# Patient Record
Sex: Female | Born: 1990 | Race: White | Hispanic: No | Marital: Single | Smoking: Never smoker
Health system: Southern US, Community
[De-identification: ages and names within clinical notes are randomized; demographics above are authoritative.]

---
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
## 2009-01-10 ENCOUNTER — Ambulatory Visit: Payer: Self-pay | Admitting: Family Medicine

## 2009-01-10 DIAGNOSIS — A6004 Herpesviral vulvovaginitis: Secondary | ICD-10-CM | POA: Insufficient documentation

## 2009-05-30 ENCOUNTER — Other Ambulatory Visit: Admission: RE | Admit: 2009-05-30 | Discharge: 2009-05-30 | Payer: Self-pay | Admitting: Family Medicine

## 2009-05-30 ENCOUNTER — Encounter: Payer: Self-pay | Admitting: Family Medicine

## 2009-05-30 ENCOUNTER — Ambulatory Visit: Payer: Self-pay | Admitting: Family Medicine

## 2009-05-30 DIAGNOSIS — N912 Amenorrhea, unspecified: Secondary | ICD-10-CM | POA: Insufficient documentation

## 2009-06-06 ENCOUNTER — Encounter: Payer: Self-pay | Admitting: Internal Medicine

## 2009-06-06 ENCOUNTER — Encounter: Payer: Self-pay | Admitting: Family Medicine

## 2009-07-06 ENCOUNTER — Inpatient Hospital Stay (HOSPITAL_COMMUNITY): Admission: AD | Admit: 2009-07-06 | Discharge: 2009-07-06 | Payer: Self-pay | Admitting: Obstetrics

## 2009-11-11 ENCOUNTER — Ambulatory Visit (HOSPITAL_COMMUNITY): Admission: RE | Admit: 2009-11-11 | Discharge: 2009-11-11 | Payer: Self-pay | Admitting: Obstetrics

## 2010-08-18 ENCOUNTER — Emergency Department (HOSPITAL_BASED_OUTPATIENT_CLINIC_OR_DEPARTMENT_OTHER)
Admission: EM | Admit: 2010-08-18 | Discharge: 2010-08-18 | Payer: Self-pay | Source: Home / Self Care | Admitting: Emergency Medicine

## 2010-08-24 LAB — DIFFERENTIAL
Basophils Absolute: 0 10*3/uL (ref 0.0–0.1)
Basophils Relative: 0 % (ref 0–1)
Eosinophils Absolute: 0.1 10*3/uL (ref 0.0–0.7)
Eosinophils Relative: 1 % (ref 0–5)
Lymphocytes Relative: 14 % (ref 12–46)
Lymphs Abs: 1.6 10*3/uL (ref 0.7–4.0)
Monocytes Absolute: 0.7 10*3/uL (ref 0.1–1.0)
Monocytes Relative: 6 % (ref 3–12)
Neutro Abs: 9.6 10*3/uL — ABNORMAL HIGH (ref 1.7–7.7)
Neutrophils Relative %: 80 % — ABNORMAL HIGH (ref 43–77)

## 2010-08-24 LAB — CBC
HCT: 36.1 % (ref 36.0–46.0)
Hemoglobin: 12.1 g/dL (ref 12.0–15.0)
MCH: 29.4 pg (ref 26.0–34.0)
MCHC: 33.5 g/dL (ref 30.0–36.0)
MCV: 87.8 fL (ref 78.0–100.0)
Platelets: 188 10*3/uL (ref 150–400)
RBC: 4.11 MIL/uL (ref 3.87–5.11)
RDW: 11.8 % (ref 11.5–15.5)
WBC: 12.1 10*3/uL — ABNORMAL HIGH (ref 4.0–10.5)

## 2010-08-24 LAB — BASIC METABOLIC PANEL
BUN: 10 mg/dL (ref 6–23)
CO2: 26 mEq/L (ref 19–32)
Calcium: 8.6 mg/dL (ref 8.4–10.5)
Chloride: 108 mEq/L (ref 96–112)
Creatinine, Ser: 0.7 mg/dL (ref 0.4–1.2)
GFR calc Af Amer: >60 mL/min (ref >60)
GFR calc non Af Amer: >60 mL/min (ref >60)
Glucose, Bld: 97 mg/dL (ref 70–99)
Potassium: 4 mEq/L (ref 3.5–5.1)
Sodium: 141 mEq/L (ref 135–145)

## 2010-08-24 LAB — URINALYSIS, ROUTINE W REFLEX MICROSCOPIC
Bilirubin Urine: NEGATIVE
Hgb urine dipstick: NEGATIVE
Ketones, ur: NEGATIVE mg/dL
Leukocytes, UA: NEGATIVE
Nitrite: POSITIVE — AB
Protein, ur: NEGATIVE mg/dL
Specific Gravity, Urine: 1.01 (ref 1.005–1.030)
Urine Glucose, Fasting: NEGATIVE mg/dL
Urobilinogen, UA: 0.2 mg/dL (ref 0.0–1.0)
pH: 7 (ref 5.0–8.0)

## 2010-08-24 LAB — URINE MICROSCOPIC-ADD ON

## 2010-08-24 LAB — PREGNANCY, URINE: Preg Test, Ur: NEGATIVE

## 2010-11-11 LAB — CBC
MCHC: 34.1 g/dL (ref 30.0–36.0)
RBC: 3.95 MIL/uL (ref 3.87–5.11)
RDW: 12.2 % (ref 11.5–15.5)

## 2010-11-11 LAB — HCG, QUANTITATIVE, PREGNANCY: hCG, Beta Chain, Quant, S: 13850 m[IU]/mL — ABNORMAL HIGH (ref <5)

## 2010-11-11 LAB — URINALYSIS, ROUTINE W REFLEX MICROSCOPIC
Bilirubin Urine: NEGATIVE
Glucose, UA: NEGATIVE mg/dL
Ketones, ur: NEGATIVE mg/dL
pH: 7 (ref 5.0–8.0)

## 2010-11-11 LAB — WET PREP, GENITAL: Yeast Wet Prep HPF POC: NONE SEEN

## 2010-11-11 LAB — GC/CHLAMYDIA PROBE AMP, GENITAL
Chlamydia, DNA Probe: NEGATIVE
GC Probe Amp, Genital: NEGATIVE

## 2010-11-11 LAB — URINE MICROSCOPIC-ADD ON

## 2010-11-11 LAB — ABO/RH: ABO/RH(D): O POS

## 2011-05-24 IMAGING — CT CT HEAD W/O CM
1 series · 16 of 30 positions shown, 20 images · non-contrast
Comparison: None.

CLINICAL DATA: 19-year-old female with syncope, frontal headache,
dizziness.

CT HEAD WITHOUT CONTRAST
TECHNIQUE: Contiguous axial images were obtained from the base of
the skull through the vertex without contrast.

[Series 2: head 4.8 h37s · axial · 0.42mm/px · z∈[-96,+40]mm · 16 of 32 slices shown, 20 images]
[im 2/32  brain]
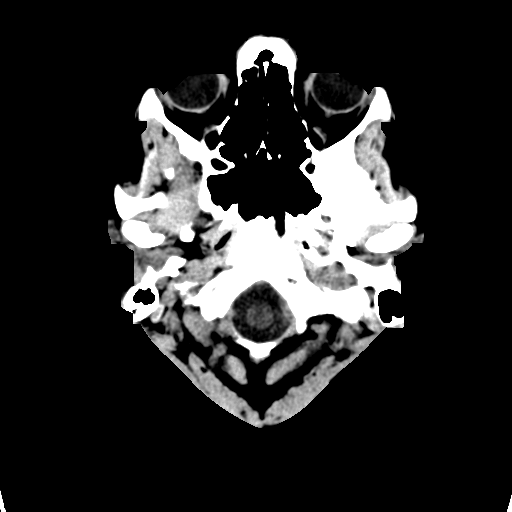
[im 2/32  bone]
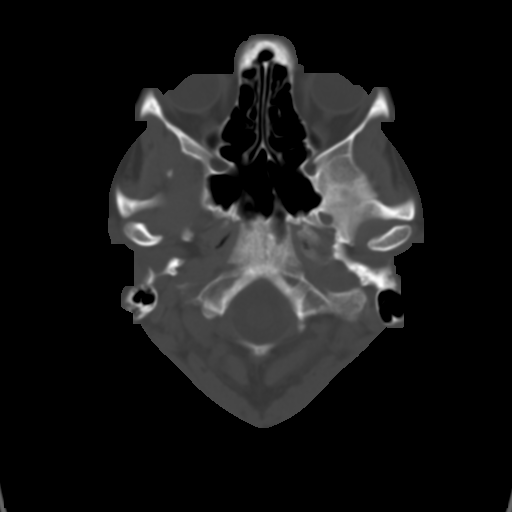
[im 4/32  brain]
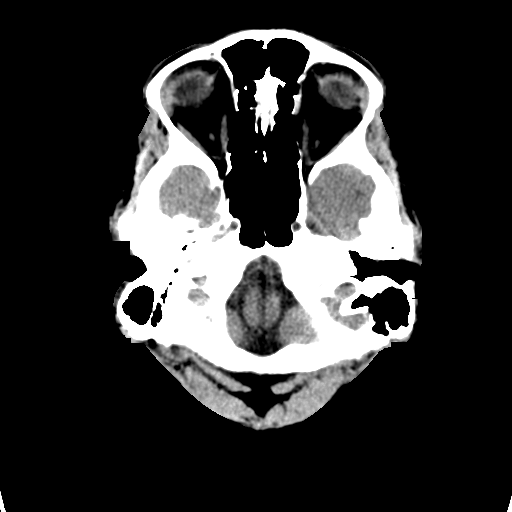
[im 6/32  brain]
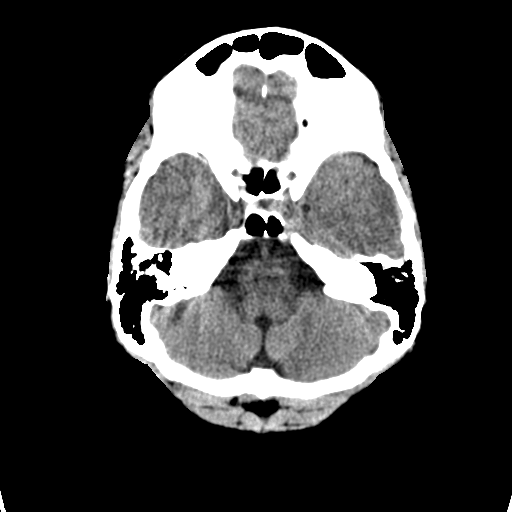
[im 8/32  brain]
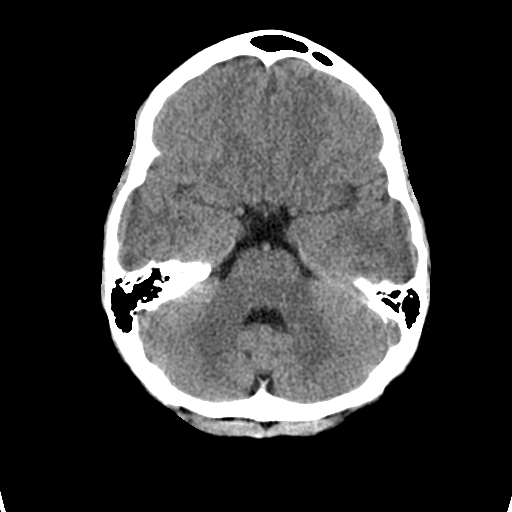
[im 9/32  brain]
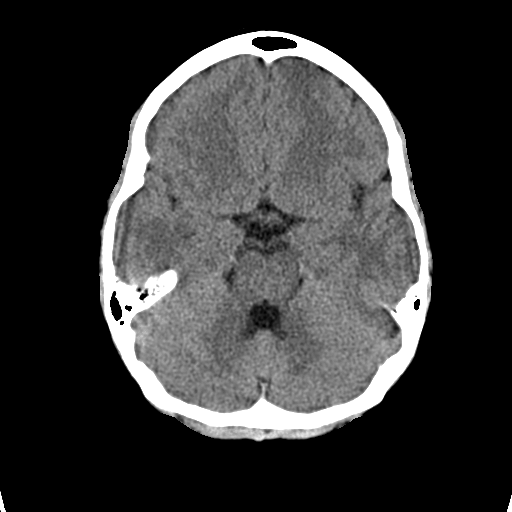
[im 9/32  bone]
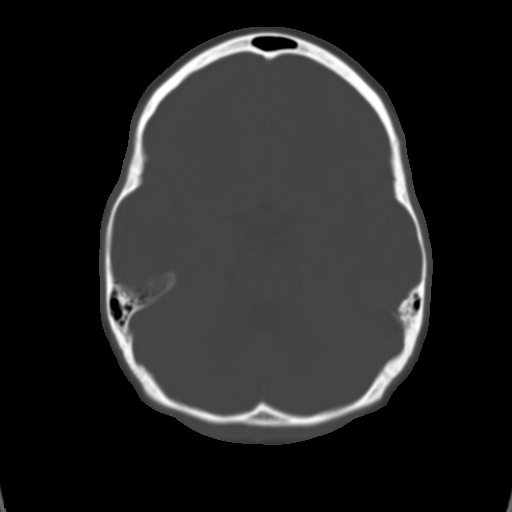
[im 11/32  brain]
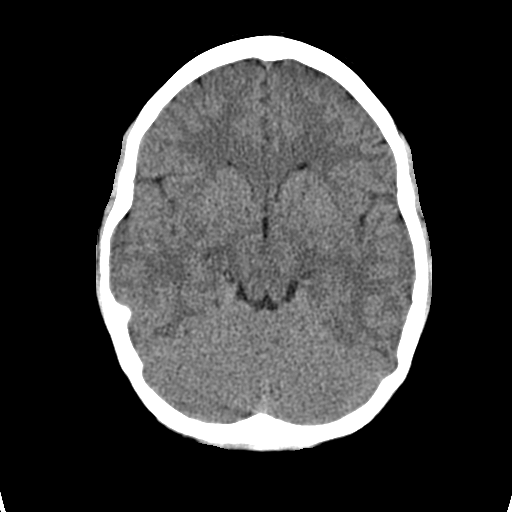
[im 13/32  brain]
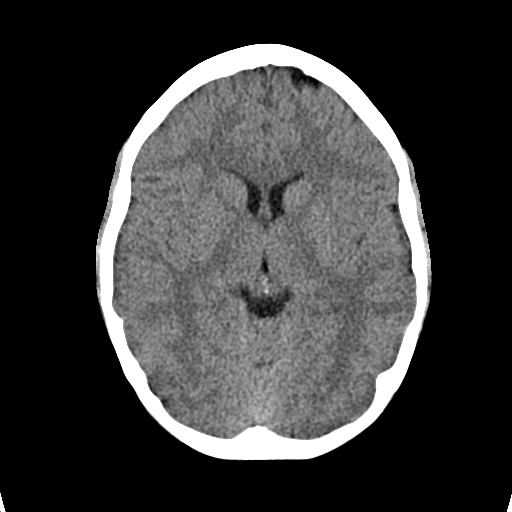
[im 15/32  brain]
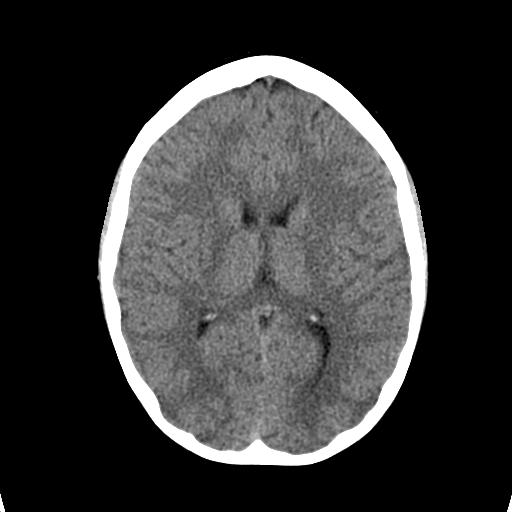
[im 17/32  brain]
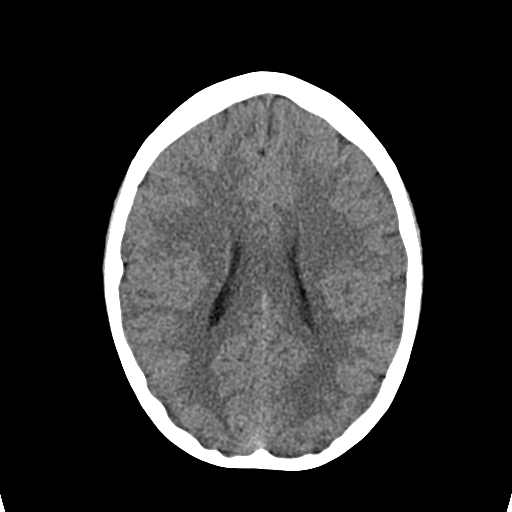
[im 17/32  bone]
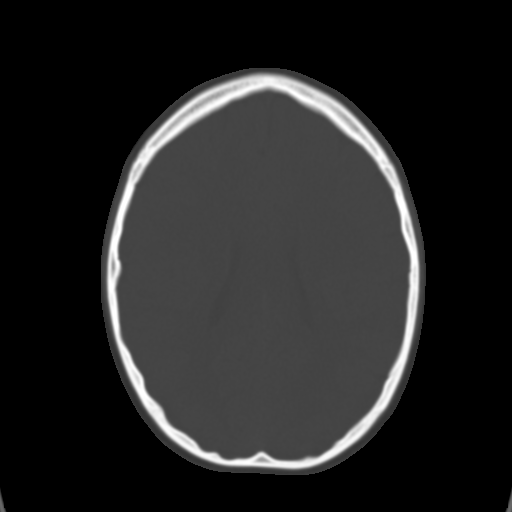
[im 19/32  brain]
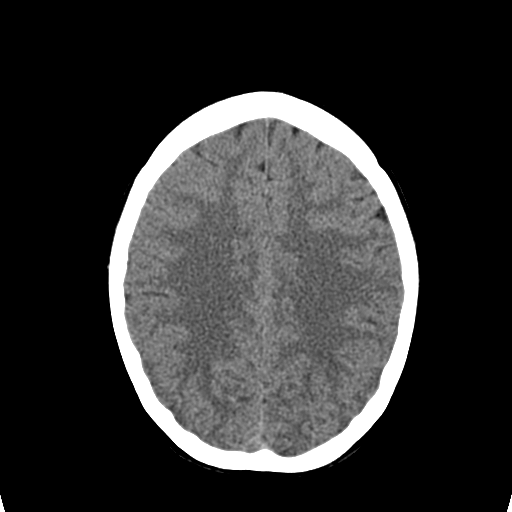
[im 21/32  brain]
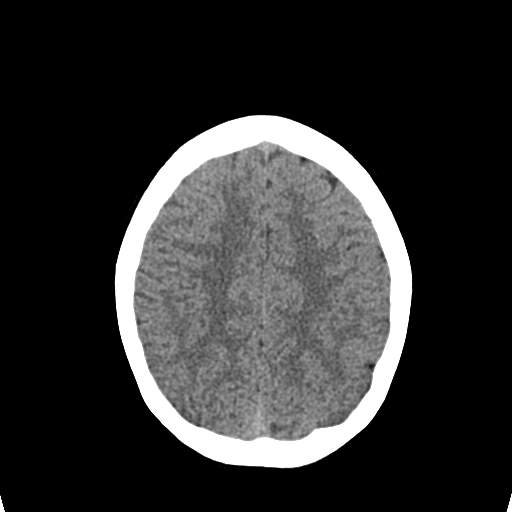
[im 23/32  brain]
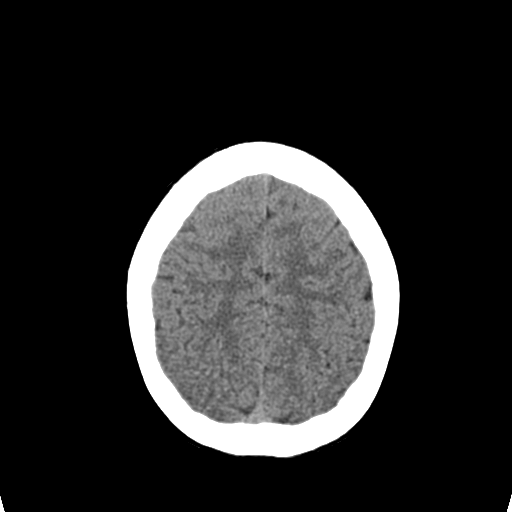
[im 24/32  brain]
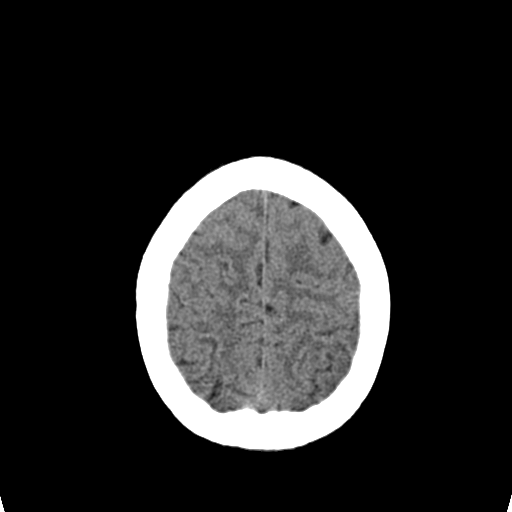
[im 24/32  bone]
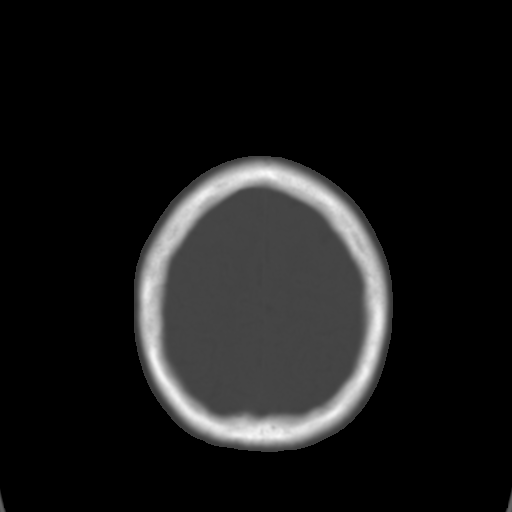
[im 26/32  brain]
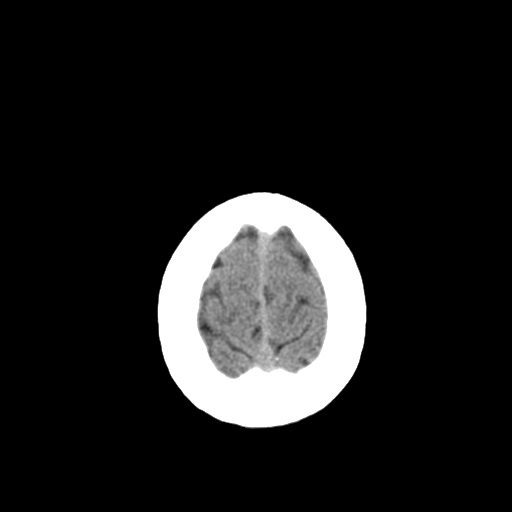
[im 28/32  brain]
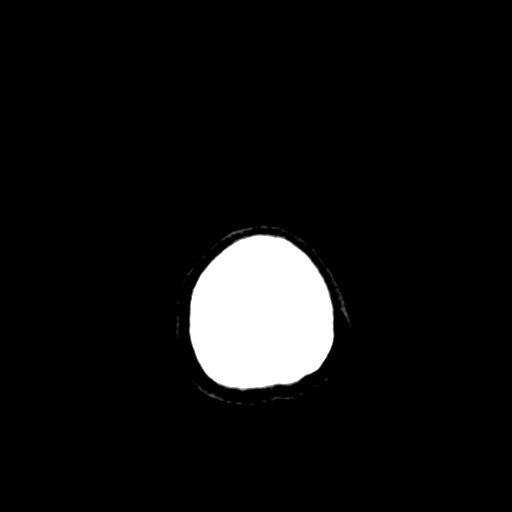
[im 30/32  brain]
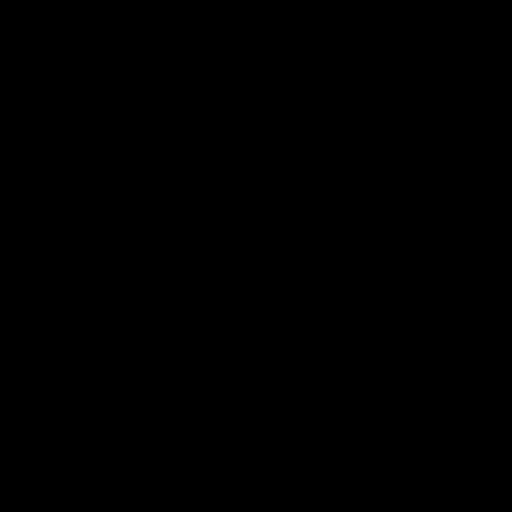

[16 of 30 positions shown; findings below may reference images not displayed]

FINDINGS: Visualized orbits and scalp soft tissues are within
normal limits.  Visualized paranasal sinuses and mastoids are
clear.  No acute osseous abnormality identified.

Cerebral volume is within normal limits for age.  No midline shift,
ventriculomegaly, mass effect, evidence of mass lesion,
intracranial hemorrhage or evidence of cortically based acute
infarction.  Gray-white matter differentiation is within normal
limits throughout the brain.  No suspicious intracranial vascular
hyperdensity.
IMPRESSION: Normal noncontrast CT appearance of the brain.

## 2015-03-24 ENCOUNTER — Emergency Department (INDEPENDENT_AMBULATORY_CARE_PROVIDER_SITE_OTHER): Payer: Self-pay

## 2015-03-24 ENCOUNTER — Encounter (HOSPITAL_COMMUNITY): Payer: Self-pay | Admitting: Emergency Medicine

## 2015-03-24 ENCOUNTER — Emergency Department (INDEPENDENT_AMBULATORY_CARE_PROVIDER_SITE_OTHER)
Admission: EM | Admit: 2015-03-24 | Discharge: 2015-03-24 | Disposition: A | Discharge status: Home / Self Care | Payer: Self-pay | Source: Home / Self Care | Attending: Family Medicine | Admitting: Family Medicine

## 2015-03-24 DIAGNOSIS — S93602A Unspecified sprain of left foot, initial encounter: Secondary | ICD-10-CM

## 2015-03-24 DIAGNOSIS — S90415A Abrasion, left lesser toe(s), initial encounter: Secondary | ICD-10-CM

## 2015-03-24 NOTE — Discharge Instructions (Signed)
Foot Sprain The muscles and cord like structures which attach muscle to bone (tendons) that surround the feet are made up of units. A foot sprain can occur at the weakest spot in any of these units. This condition is most often caused by injury to or overuse of the foot, as from playing contact sports, or aggravating a previous injury, or from poor conditioning, or obesity. SYMPTOMS  Pain with movement of the foot.  Tenderness and swelling at the injury site.  Loss of strength is present in moderate or severe sprains. THE THREE GRADES OR SEVERITY OF FOOT SPRAIN ARE:  Mild (Grade I): Slightly pulled muscle without tearing of muscle or tendon fibers or loss of strength.  Moderate (Grade II): Tearing of fibers in a muscle, tendon, or at the attachment to bone, with small decrease in strength.  Severe (Grade III): Rupture of the muscle-tendon-bone attachment, with separation of fibers. Severe sprain requires surgical repair. Often repeating (chronic) sprains are caused by overuse. Sudden (acute) sprains are caused by direct injury or over-use. DIAGNOSIS  Diagnosis of this condition is usually by your own observation. If problems continue, a caregiver may be required for further evaluation and treatment. X-rays may be required to make sure there are not breaks in the bones (fractures) present. Continued problems may require physical therapy for treatment. PREVENTION  Use strength and conditioning exercises appropriate for your sport.  Warm up properly prior to working out.  Use athletic shoes that are made for the sport you are participating in.  Allow adequate time for healing. Early return to activities makes repeat injury more likely, and can lead to an unstable arthritic foot that can result in prolonged disability. Mild sprains generally heal in 3 to 10 days, with moderate and severe sprains taking 2 to 10 weeks. Your caregiver can help you determine the proper time required for  healing. HOME CARE INSTRUCTIONS   Apply ice to the injury for 15-20 minutes, 03-04 times per day. Put the ice in a plastic bag and place a towel between the bag of ice and your skin.  An elastic wrap (like an Ace bandage) may be used to keep swelling down.  Keep foot above the level of the heart, or at least raised on a footstool, when swelling and pain are present.  Try to avoid use other than gentle range of motion while the foot is painful. Do not resume use until instructed by your caregiver. Then begin use gradually, not increasing use to the point of pain. If pain does develop, decrease use and continue the above measures, gradually increasing activities that do not cause discomfort, until you gradually achieve normal use.  Use crutches if and as instructed, and for the length of time instructed.  Keep injured foot and ankle wrapped between treatments.  Massage foot and ankle for comfort and to keep swelling down. Massage from the toes up towards the knee.  Only take over-the-counter or prescription medicines for pain, discomfort, or fever as directed by your caregiver. SEEK IMMEDIATE MEDICAL CARE IF:   Your pain and swelling increase, or pain is not controlled with medications.  You have loss of feeling in your foot or your foot turns cold or blue.  You develop new, unexplained symptoms, or an increase of the symptoms that brought you to your caregiver. MAKE SURE YOU:   Understand these instructions.  Will watch your condition.  Will get help right away if you are not doing well or get worse. Document Released:  01/15/2002 Document Revised: 10/18/2011 Document Reviewed: 03/14/2008 [employer] Patient Information 2015 [place], [place]. This information is not intended to replace advice given to you by your health care provider. Make sure you discuss any questions you have with your health care provider.  Abrasion Soak toes in warm salt water to clean them a couple of times.  Bandaids. Watch for infections Other wise use ice to the foot for swelling An abrasion is a cut or scrape of the skin. Abrasions do not extend through all layers of the skin and most heal within 10 days. It is important to care for your abrasion properly to prevent infection. CAUSES  Most abrasions are caused by falling on, or gliding across, the ground or other surface. When your skin rubs on something, the outer and inner layer of skin rubs off, causing an abrasion. DIAGNOSIS  Your caregiver will be able to diagnose an abrasion during a physical exam.  TREATMENT  Your treatment depends on how large and deep the abrasion is. Generally, your abrasion will be cleaned with water and a mild soap to remove any dirt or debris. An antibiotic ointment may be put over the abrasion to prevent an infection. A bandage (dressing) may be wrapped around the abrasion to keep it from getting dirty.  You may need a tetanus shot if:  You cannot remember when you had your last tetanus shot.  You have never had a tetanus shot.  The injury broke your skin. If you get a tetanus shot, your arm may swell, get red, and feel warm to the touch. This is common and not a problem. If you need a tetanus shot and you choose not to have one, there is a rare chance of getting tetanus. Sickness from tetanus can be serious.  HOME CARE INSTRUCTIONS   If a dressing was applied, change it at least once a day or as directed by your caregiver. If the bandage sticks, soak it off with warm water.   Wash the area with water and a mild soap to remove all the ointment 2 times a day. Rinse off the soap and pat the area dry with a clean towel.   Reapply any ointment as directed by your caregiver. This will help prevent infection and keep the bandage from sticking. Use gauze over the wound and under the dressing to help keep the bandage from sticking.   Change your dressing right away if it becomes wet or dirty.   Only take  over-the-counter or prescription medicines for pain, discomfort, or fever as directed by your caregiver.   Follow up with your caregiver within 24-48 hours for a wound check, or as directed. If you were not given a wound-check appointment, look closely at your abrasion for redness, swelling, or pus. These are signs of infection. SEEK IMMEDIATE MEDICAL CARE IF:   You have increasing pain in the wound.   You have redness, swelling, or tenderness around the wound.   You have pus coming from the wound.   You have a fever or persistent symptoms for more than 2-3 days.  You have a fever and your symptoms suddenly get worse.  You have a bad smell coming from the wound or dressing.  MAKE SURE YOU:   Understand these instructions.  Will watch your condition.  Will get help right away if you are not doing well or get worse. Document Released: 05/05/2005 Document Revised: 07/12/2012 Document Reviewed: 06/29/2011 [facility] Patient Information 2015 [place], [place]. This information is not intended  to replace advice given to you by your health care provider. Make sure you discuss any questions you have with your health care provider. ° °

## 2015-03-24 NOTE — ED Notes (Signed)
Right foot injury that occurred 8/13.  Patient was running in the dark across grass and onto driveway.  Where driveway and grass meets there is a dip in the terrain.  This is where stepped and injured her foot.  Foot is swollen, painful foot on top and underneath at instep of foot.  Pedal pulses 2 plus.

## 2015-03-24 NOTE — ED Provider Notes (Signed)
CSN: [id number]     Arrival date & time 03/24/15  1545 History   First MD Initiated Contact with Patient 03/24/15 1804     Chief Complaint  Patient presents with  . Foot Injury   (Consider location/radiation/quality/duration/timing/severity/associated sxs/prior Treatment) HPI Comments: 3 evenings ago this 24 year old female was running in the dark and fell injuring her left foot. She is unsure as to how the mechanism of injury occurred otherwise. She is complaining of pain in the dorsum of the foot especially with weightbearing and palpation. Denies pain to the ankle. There are superficial abrasions to the great toe and the third toe.   History reviewed. No pertinent past medical history. History reviewed. No pertinent past surgical history. No family history on file. Social History  Substance Use Topics  . Smoking status: Never Smoker   . Smokeless tobacco: None  . Alcohol Use: No   OB History    No data available     Review of Systems  Constitutional: Negative for fever, chills, activity change and fatigue.  HENT: Negative.   Cardiovascular: Negative.   Musculoskeletal: Positive for joint swelling and gait problem. Negative for neck pain and neck stiffness.       As per HPI  Skin: Positive for color change. Negative for rash.  Neurological: Negative.     Allergies  Review of patient's allergies indicates no known allergies.  Home Medications   Prior to Admission medications   Not on File   BP 105/62 mmHg  Pulse 55  Temp(Src) 97.8 F (36.6 C) (Oral)  Resp 18  SpO2 100% Physical Exam  Constitutional: She is oriented to person, place, and time. She appears well-developed and well-nourished. No distress.  HENT:  Head: Normocephalic and atraumatic.  Eyes: EOM are normal.  Neck: Normal range of motion. Neck supple.  Pulmonary/Chest: Effort normal. No respiratory distress.  Musculoskeletal: Normal range of motion. She exhibits edema and tenderness.  Tenderness and  swelling with mild ecchymosis to the dorsum of the left foot. No tenderness, swelling or deformity to the ankle. No pain to the ankle. She is able to with alert toes. Distal neurovascular and sensation is intact.  Neurological: She is alert and oriented to person, place, and time. No cranial nerve deficit. She exhibits normal muscle tone.  Skin: Skin is warm and dry.  Psychiatric: She has a normal mood and affect.  Nursing note and vitals reviewed.   ED Course  Procedures (including critical care time) Labs Review Labs Reviewed - No data to display  Imaging Review Dg Foot Complete Left  03/24/2015   CLINICAL DATA:  Left foot injury 3 days ago with pain and bruising dorsally.  EXAM: LEFT FOOT - COMPLETE 3+ VIEW  COMPARISON:  None.  FINDINGS: There is no evidence of fracture or dislocation. There is no evidence of arthropathy or other focal bone abnormality. Soft tissues are unremarkable.  IMPRESSION: Negative.   Electronically Signed   By: Elberta Fortis M.D.   On: 03/24/2015 18:57     MDM   1. Foot sprain, left, initial encounter   2. Abrasion of toe of left foot, initial encounter    RICE, use your crutches Wash and keep toe abrasions clean, watch for infection. Bandaids.    Hayden Rasmussen, NP 03/24/15 [phone]

## 2015-03-24 NOTE — ED Notes (Signed)
Patient ready to go, unwilling to wait for dressing or wrap.

## 2015-12-28 IMAGING — DX DG FOOT COMPLETE 3+V*L*
3 series · 3 of 3 positions shown · non-contrast
Comparison: None.

CLINICAL DATA: Left foot injury 3 days ago with pain and bruising
dorsally.

EXAM:
LEFT FOOT - COMPLETE 3+ VIEW

[foot ap]
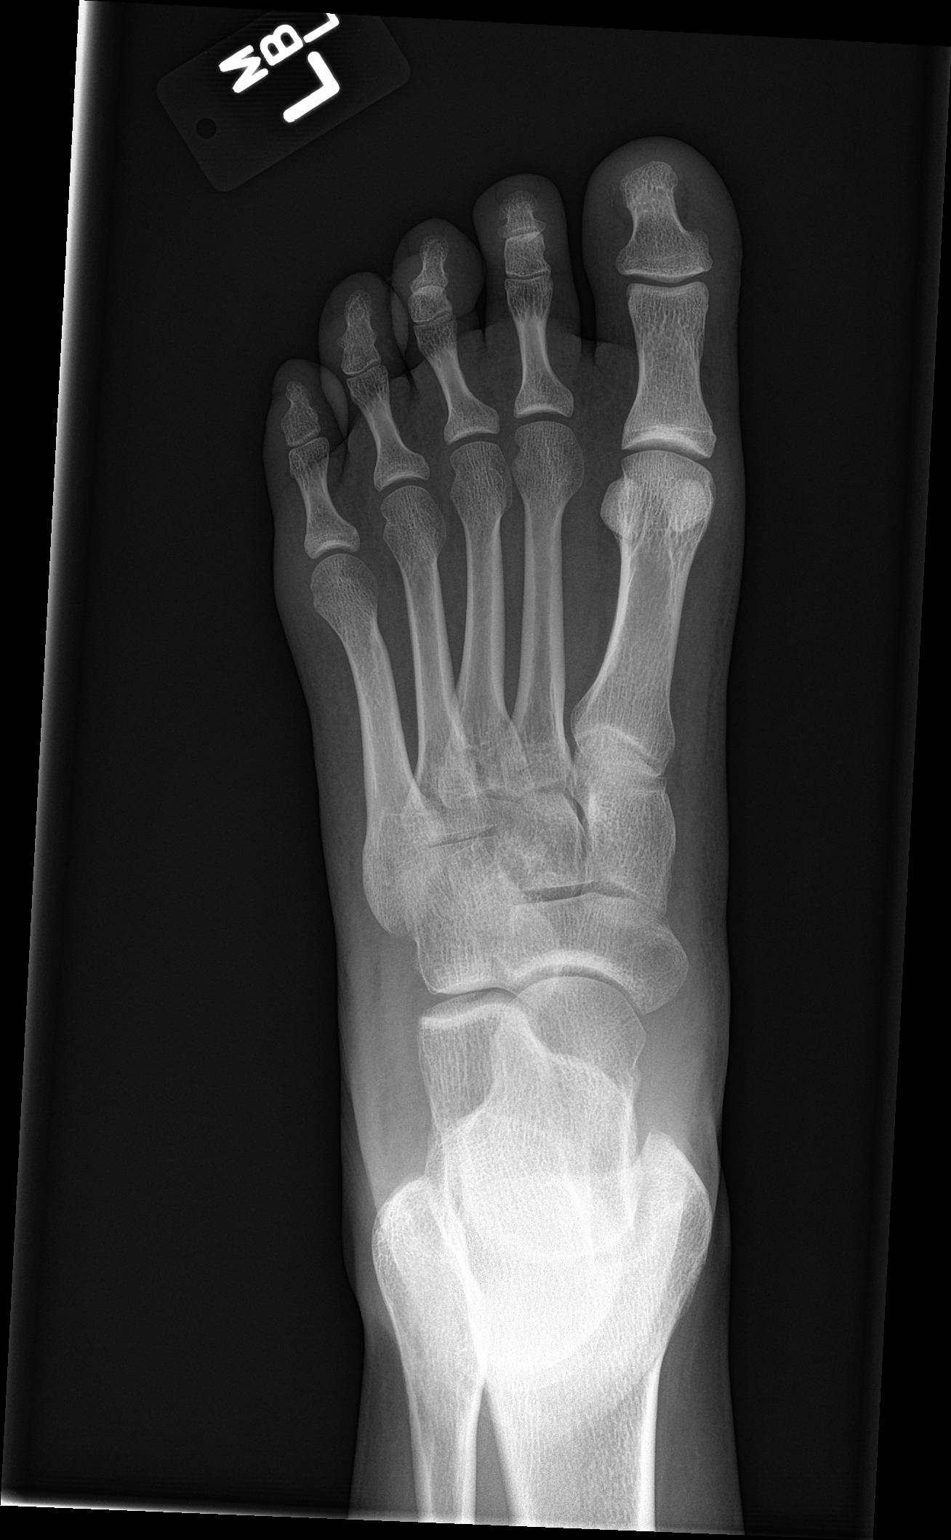

[foot obl]
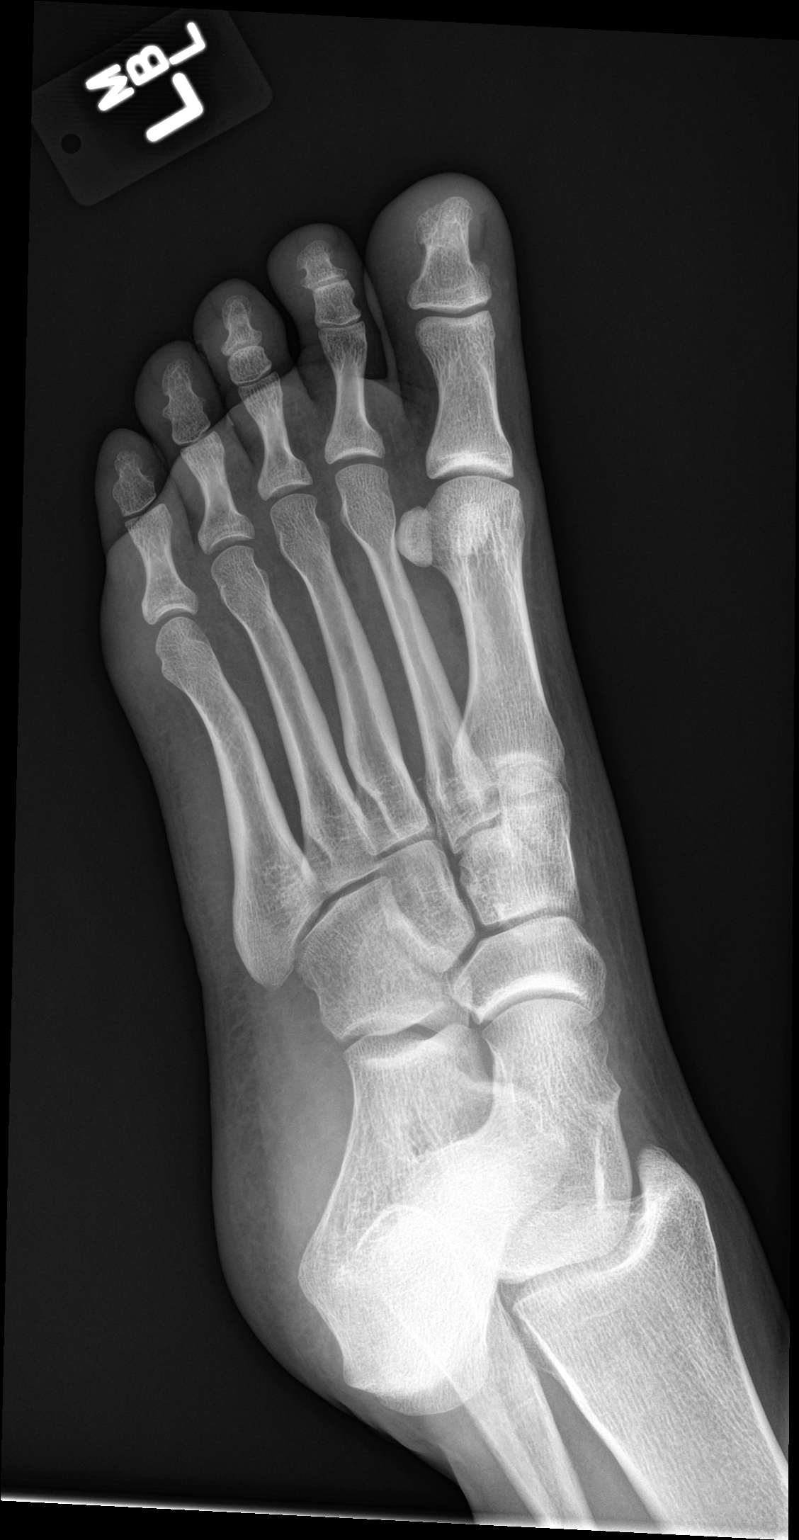

[foot lat]
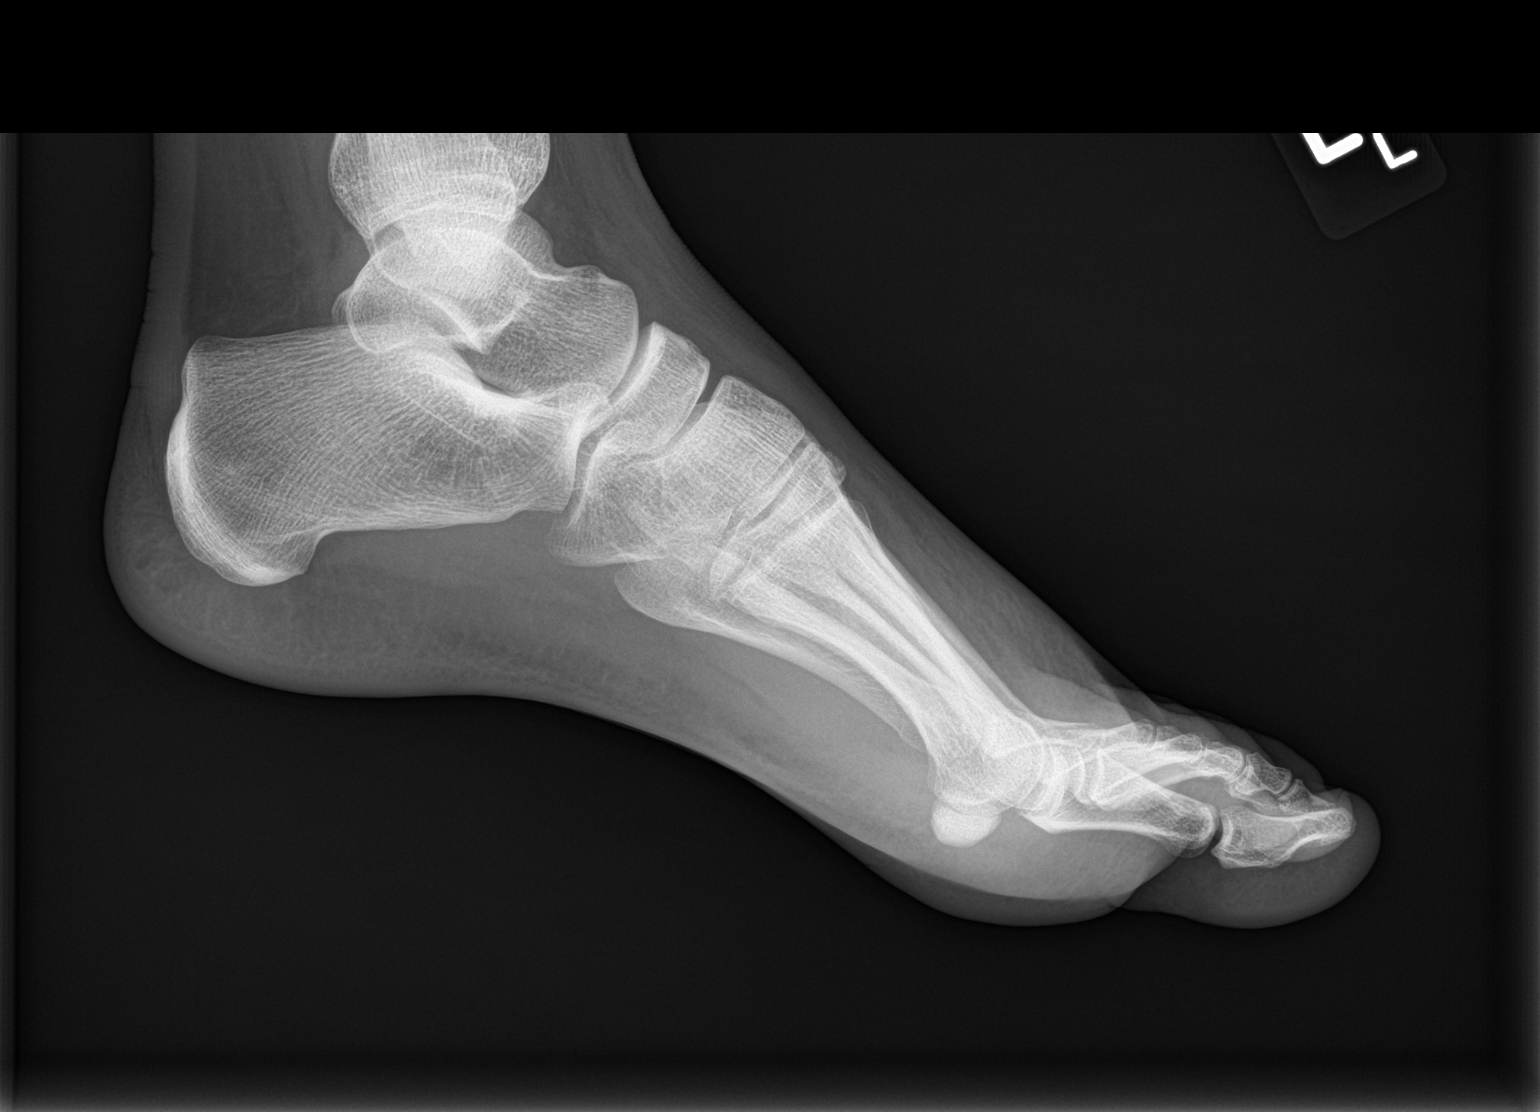

[3 of 3 positions shown; findings below may reference images not displayed]

FINDINGS: There is no evidence of fracture or dislocation. There is no
evidence of arthropathy or other focal bone abnormality. Soft
tissues are unremarkable.
IMPRESSION: Negative.

## 2019-07-23 ENCOUNTER — Other Ambulatory Visit: Payer: Self-pay

## 2019-07-23 DIAGNOSIS — Z20822 Contact with and (suspected) exposure to covid-19: Secondary | ICD-10-CM

## 2019-07-24 LAB — NOVEL CORONAVIRUS, NAA: SARS-CoV-2, NAA: NOT DETECTED
